# Patient Record
Sex: Female | Born: 1970 | Race: White | Hispanic: No | Smoking: Current every day smoker
Health system: Southern US, Community
[De-identification: ages and names within clinical notes are randomized; demographics above are authoritative.]

## PROBLEM LIST (undated history)

## (undated) HISTORY — PX: EYE SURGERY: SHX253

## (undated) HISTORY — PX: BACK SURGERY: SHX140

---

## 2008-07-18 ENCOUNTER — Emergency Department (HOSPITAL_COMMUNITY): Admission: EM | Admit: 2008-07-18 | Discharge: 2008-07-18 | Payer: Self-pay | Admitting: Family Medicine

## 2009-03-16 ENCOUNTER — Emergency Department (HOSPITAL_COMMUNITY): Admission: EM | Admit: 2009-03-16 | Discharge: 2009-03-16 | Payer: Self-pay | Admitting: Emergency Medicine

## 2010-04-09 LAB — DIFFERENTIAL
Eosinophils Absolute: 0 10*3/uL (ref 0.0–0.7)
Eosinophils Relative: 0 % (ref 0–5)
Lymphs Abs: 0.9 10*3/uL (ref 0.7–4.0)
Monocytes Relative: 6 % (ref 3–12)

## 2010-04-09 LAB — BASIC METABOLIC PANEL
Chloride: 108 mEq/L (ref 96–112)
GFR calc Af Amer: >60 mL/min (ref >60)
GFR calc non Af Amer: >60 mL/min (ref >60)
Glucose, Bld: 126 mg/dL — ABNORMAL HIGH (ref 70–99)
Potassium: 3.6 mEq/L (ref 3.5–5.1)

## 2010-04-09 LAB — URINALYSIS, ROUTINE W REFLEX MICROSCOPIC
Ketones, ur: NEGATIVE mg/dL
Nitrite: NEGATIVE
Urobilinogen, UA: 0.2 mg/dL (ref 0.0–1.0)
pH: 6.5 (ref 5.0–8.0)

## 2010-04-09 LAB — CBC
HCT: 32.2 % — ABNORMAL LOW (ref 36.0–46.0)
MCHC: 35 g/dL (ref 30.0–36.0)
MCV: 100.9 fL — ABNORMAL HIGH (ref 78.0–100.0)
RBC: 3.19 MIL/uL — ABNORMAL LOW (ref 3.87–5.11)
WBC: 12.9 10*3/uL — ABNORMAL HIGH (ref 4.0–10.5)

## 2010-04-09 LAB — URINE MICROSCOPIC-ADD ON

## 2010-04-09 LAB — URINE CULTURE

## 2010-04-28 LAB — POCT URINALYSIS DIP (DEVICE)
Glucose, UA: NEGATIVE mg/dL
Ketones, ur: NEGATIVE mg/dL
Protein, ur: NEGATIVE mg/dL
Specific Gravity, Urine: <=1.005 (ref 1.005–1.030)
Urobilinogen, UA: 0.2 mg/dL (ref 0.0–1.0)
pH: 6 (ref 5.0–8.0)

## 2011-03-02 ENCOUNTER — Ambulatory Visit: Payer: Self-pay | Admitting: Emergency Medicine

## 2011-03-02 VITALS — BP 129/72 | HR 74 | Temp 98.1°F | Resp 16 | Ht 61.5 in | Wt 140.0 lb

## 2011-03-02 DIAGNOSIS — J00 Acute nasopharyngitis [common cold]: Secondary | ICD-10-CM

## 2011-03-02 DIAGNOSIS — J40 Bronchitis, not specified as acute or chronic: Secondary | ICD-10-CM

## 2011-03-02 DIAGNOSIS — J069 Acute upper respiratory infection, unspecified: Secondary | ICD-10-CM

## 2011-03-02 DIAGNOSIS — F17200 Nicotine dependence, unspecified, uncomplicated: Secondary | ICD-10-CM

## 2011-03-02 DIAGNOSIS — R05 Cough: Secondary | ICD-10-CM

## 2011-03-02 MED ORDER — FLUTICASONE PROPIONATE 50 MCG/ACT NA SUSP: 2.0000 | Freq: Every day | NASAL | Status: AC

## 2011-03-02 MED ORDER — BENZONATATE 200 MG PO CAPS: 200.0000 mg | ORAL_CAPSULE | Freq: Two times a day (BID) | ORAL | Status: DC | PRN

## 2011-03-02 MED ORDER — DOXYCYCLINE HYCLATE 100 MG PO TABS: 100.0000 mg | ORAL_TABLET | Freq: Two times a day (BID) | ORAL | Status: AC

## 2011-03-02 MED ORDER — HYDROCODONE-HOMATROPINE 5-1.5 MG/5ML PO SYRP: 5.0000 mL | ORAL_SOLUTION | Freq: Every day | ORAL | Status: DC

## 2011-03-02 NOTE — Progress Notes (Signed)
  Subjective:    Patient ID: Joy Watkins, female    DOB: 04/14/1970, 41 y.o.   MRN: [id number]  HPI patient enters with 4-5 day history of head congestion and nasal to and cough. She has a history recurrent breast infections. She stopped smoking 42 days ago. She had been a heavy smoker for 20 years. She's had a vibrating sensation in her right ear does look within both of the ears. They are benign. She states that she is willing to come back for a chest x-ray at sometime in the coming months but is not able to do that tonight.    Review of Systems her review of systems are just as related to the present illness.     Objective:   Physical Exam her physical exam her TMs are clear nose is congested and turbinates are reddish. Her neck is supple her chest is symmetrical expansion and clear to auscultation and percussion.        Assessment & Plan:  Assessment is recurrent upper respiratory infections. Patient however is for tracheobronchitis due to her heavy smoking history. She has responded well to doxycycline in the past.

## 2011-03-13 ENCOUNTER — Telehealth: Payer: Self-pay

## 2011-03-13 DIAGNOSIS — J40 Bronchitis, not specified as acute or chronic: Secondary | ICD-10-CM

## 2011-03-13 MED ORDER — BENZONATATE 100 MG PO CAPS: ORAL_CAPSULE | ORAL | Status: AC

## 2011-03-13 MED ORDER — HYDROCODONE-HOMATROPINE 5-1.5 MG/5ML PO SYRP: 5.0000 mL | ORAL_SOLUTION | Freq: Every day | ORAL | Status: DC

## 2011-03-13 NOTE — Telephone Encounter (Signed)
Please advise 

## 2011-03-13 NOTE — Telephone Encounter (Signed)
Pt would like a refill on her cough medicine and the also the cough syrup that she was prescribed. Pt does not have the names of the medications.

## 2011-03-13 NOTE — Telephone Encounter (Signed)
Authorized.  The syrup needs to be called in.

## 2011-03-14 NOTE — Telephone Encounter (Signed)
Patient notified and cough syrup called in.

## 2011-06-15 ENCOUNTER — Emergency Department (HOSPITAL_COMMUNITY)
Admission: EM | Admit: 2011-06-15 | Discharge: 2011-06-15 | Disposition: A | Discharge status: Home / Self Care | Payer: Self-pay | Attending: Emergency Medicine | Admitting: Emergency Medicine

## 2011-06-15 ENCOUNTER — Encounter (HOSPITAL_COMMUNITY): Payer: Self-pay | Admitting: Emergency Medicine

## 2011-06-15 DIAGNOSIS — Z87891 Personal history of nicotine dependence: Secondary | ICD-10-CM | POA: Insufficient documentation

## 2011-06-15 DIAGNOSIS — K089 Disorder of teeth and supporting structures, unspecified: Secondary | ICD-10-CM | POA: Insufficient documentation

## 2011-06-15 DIAGNOSIS — K0889 Other specified disorders of teeth and supporting structures: Secondary | ICD-10-CM

## 2011-06-15 MED ORDER — CLINDAMYCIN HCL 300 MG PO CAPS
300.0000 mg | ORAL_CAPSULE | Freq: Once | ORAL | Status: AC
  Administered 2011-06-15: 300 mg via ORAL
  Filled 2011-06-15: qty 1

## 2011-06-15 MED ORDER — HYDROCODONE-ACETAMINOPHEN 5-325 MG PO TABS
2.0000 | ORAL_TABLET | Freq: Once | ORAL | Status: AC
  Administered 2011-06-15: 2 via ORAL
  Filled 2011-06-15: qty 2

## 2011-06-15 MED ORDER — HYDROCODONE-ACETAMINOPHEN 5-500 MG PO TABS: 1.0000 | ORAL_TABLET | Freq: Four times a day (QID) | ORAL | Status: AC | PRN

## 2011-06-15 MED ORDER — CLINDAMYCIN HCL 150 MG PO CAPS: 300.0000 mg | ORAL_CAPSULE | Freq: Three times a day (TID) | ORAL | Status: AC

## 2011-06-15 NOTE — ED Provider Notes (Signed)
History     CSN: [id number]  Arrival date & time 06/15/11  0206   First MD Initiated Contact with Patient 06/15/11 [phone]      Chief Complaint  Patient presents with  . Dental Pain    (Consider location/radiation/quality/duration/timing/severity/associated sxs/prior treatment) HPI Comments: Female with a history of recurrent dental infections and need for routine amounts and has been on amoxicillin for the last 9 days, hydrocodone for pain and no she initially felt better as of late felt increased pain and a purulent taste in her mouth. She is supposed to have root canals done on both the upper right and left molars and has treated prophylactically with antibiotics prior to the surgery. The pain has increased over the last 3 days, is persistent, worse with chewing and has associated increased sensitivity to temperature. She denies any jaw swelling or fevers or vomiting  Patient is a 41 y.o. female presenting with tooth pain. The history is provided by the patient.  Dental PainPrimary symptoms do not include fever or sore throat.  Additional symptoms do not include: facial swelling and trouble swallowing.    History reviewed. No pertinent past medical history.  Past Surgical History  Procedure Date  . Eye surgery   . Back surgery     No family history on file.  History  Substance Use Topics  . Smoking status: Former Games developer  . Smokeless tobacco: Not on file   Comment: quit 3 months ago  . Alcohol Use: 0.6 oz/week    1 Glasses of wine per week    OB History    Grav Para Term Preterm Abortions TAB SAB Ect Mult Living                  Review of Systems  Constitutional: Negative for fever and chills.  HENT: Positive for dental problem. Negative for sore throat, facial swelling, trouble swallowing and voice change.        Toothache  Gastrointestinal: Negative for nausea and vomiting.    Allergies  Zithromax  Home Medications   Current Outpatient Rx  Name Route Sig  Dispense Refill  . AMOXICILLIN 500 MG PO CAPS Oral Take 500 mg by mouth 3 (three) times daily.    Marland Kitchen FLUTICASONE PROPIONATE 50 MCG/ACT NA SUSP Nasal Place 2 sprays into the nose daily. 16 g 6  . HYDROCODONE-ACETAMINOPHEN 5-500 MG PO TABS Oral Take 1 tablet by mouth every 6 (six) hours as needed.    Marland Kitchen CLINDAMYCIN HCL 150 MG PO CAPS Oral Take 2 capsules (300 mg total) by mouth 3 (three) times daily. May dispense as 150mg  capsules 60 capsule 0  . HYDROCODONE-ACETAMINOPHEN 5-500 MG PO TABS Oral Take 1-2 tablets by mouth every 6 (six) hours as needed for pain. 15 tablet 0    BP 126/69  Pulse 75  Temp(Src) 98.3 F (36.8 C) (Oral)  Resp 20  SpO2 100%  LMP 06/01/2011  Physical Exam  Nursing note and vitals reviewed. Constitutional: She appears well-developed and well-nourished. No distress.  HENT:  Head: Normocephalic and atraumatic.  Mouth/Throat: Oropharynx is clear and moist. No oropharyngeal exudate.       Dental Disease - upper right and left rear molars with dental disease, no surrounding palpable abscesses or areas of fluctuance. Tenderness over these teeth, normal sublingual area without tenderness  Eyes: Conjunctivae are normal. No scleral icterus.  Neck: Normal range of motion. Neck supple. No thyromegaly present.  Cardiovascular: Normal rate and regular rhythm.   Pulmonary/Chest: Effort normal  and breath sounds normal.  Lymphadenopathy:    She has cervical adenopathy ( Right).  Neurological: She is alert.  Skin: Skin is warm and dry. No rash noted. She is not diaphoretic.    ED Course  Procedures (including critical care time)  Labs Reviewed - No data to display No results found.   1. Pain, dental       MDM  Overall well appearing with normal vital signs but has tender teeth and likely has periosteal infections, clindamycin ordered and prescribed, hydrocodone for home, followup with dentist in 2 days at her appointment.  Discharge Prescriptions  include:  Clindamycin Hydrocodone         Vida Roller, MD 06/15/11 [phone]

## 2011-06-15 NOTE — ED Notes (Signed)
Pt states she has 10/10 pain in her mouth. She has been taking ATB and has been out for 2 days. She stated when she was taking the ATB, the pain was controlled. Denies bleeding, nausea,vomiting.

## 2011-06-15 NOTE — ED Notes (Signed)
Pt is scheduled for surgery on her teeth on Wednesday but has run out of her amoxicillin and hydrocodone. States she is having trouble eating and the pain is unbearable.

## 2012-04-20 ENCOUNTER — Ambulatory Visit (INDEPENDENT_AMBULATORY_CARE_PROVIDER_SITE_OTHER): Payer: Commercial Managed Care - PPO | Admitting: Family Medicine

## 2012-04-20 VITALS — BP 123/82 | HR 71 | Temp 98.3°F | Resp 16 | Ht 61.0 in | Wt 141.8 lb

## 2012-04-20 DIAGNOSIS — J40 Bronchitis, not specified as acute or chronic: Secondary | ICD-10-CM

## 2012-04-20 DIAGNOSIS — J039 Acute tonsillitis, unspecified: Secondary | ICD-10-CM

## 2012-04-20 MED ORDER — AMOXICILLIN 500 MG PO CAPS: 500.0000 mg | ORAL_CAPSULE | Freq: Three times a day (TID) | ORAL | Status: DC

## 2012-04-20 MED ORDER — BENZONATATE 200 MG PO CAPS: 200.0000 mg | ORAL_CAPSULE | Freq: Two times a day (BID) | ORAL | Status: AC | PRN

## 2012-04-20 MED ORDER — HYDROXYZINE HCL 25 MG PO TABS: 25.0000 mg | ORAL_TABLET | Freq: Three times a day (TID) | ORAL | Status: AC | PRN

## 2012-04-20 MED ORDER — BENZONATATE 200 MG PO CAPS: 200.0000 mg | ORAL_CAPSULE | Freq: Two times a day (BID) | ORAL | Status: DC | PRN

## 2012-04-20 MED ORDER — HYDROCODONE-HOMATROPINE 5-1.5 MG/5ML PO SYRP: 2.5000 mL | ORAL_SOLUTION | Freq: Every day | ORAL | Status: AC

## 2012-04-20 MED ORDER — HYDROXYZINE HCL 25 MG PO TABS: 25.0000 mg | ORAL_TABLET | Freq: Three times a day (TID) | ORAL | Status: DC | PRN

## 2012-04-20 MED ORDER — AMOXICILLIN 500 MG PO CAPS: 500.0000 mg | ORAL_CAPSULE | Freq: Three times a day (TID) | ORAL | Status: AC

## 2012-04-20 NOTE — Patient Instructions (Signed)
Continue flonase Continue tessalon Perles Try the antibiotic I am giving you.  Try the hydroxyzine to help with sleep as well as allergies.  I would add zyrtec 10mg  at night to help with underlying allergies that I think are contributing.  Come back in in 2 days if not better and we can discuss cough medicine.

## 2012-04-20 NOTE — Progress Notes (Signed)
Chief complaint: Sore ear right side and cough x3 weeks  History of present illness: Patient is a 42 year old female who is on chronic pain medications Coumadin with a three-week history of cough, sinus pressure, and right ear fullness. Patient denies any fevers chills, chest pain, shortness of breath. Patient has had this problem before and was diagnosed with bronchitis was given azithromycin and did have an allergic reaction. Patient did respond very well to amoxicillin. Patient's concern the distal fragment was a bronchitis and is hoping that she could same treatment regimen as before. Patient has tried to continue her Flonase, has tried [employer] with minimal benefit. The patient states the cough is keep her at night which is making this illness worse   physical exam: Blood pressure 123/82, pulse 71, temperature 98.3 F (36.8 C), temperature source Oral, resp. rate 16, height 5\' 1"  (1.549 m), weight 141 lb 12.8 oz (64.32 kg), SpO2 99.00%. General: No apparent distress alert and oriented x3 mood and affect normal HEENT: Pupils equal round Accommodation, extra ocular movements intact, tympanic membranes are visualized bilaterally and does have mild air fluid levels behind the tympanic membrane. This is nonbulging and nonerythematous. Posterior pharynx does have some erythema with possible exudate. Patient does have sinus pressure pain and tenderness to percussion. Positive anterior lymphadenopathy. Pulmonary: Clear to auscultation bilaterally Cardiovascular: Regular and rhythm no murmur appreciated Skin: Warm dry no signs of rash.   Assessment: Tonsillitis question will sinusitis  Plan: Patient was given amoxicillin was given a refill of her Tessalon Perles. After a significantly long discussion I did decide to give patient some posterior. Patient told that she would like a refill of this medication secondary to her pain medication contractured has. Patient will call to make sure that it is  okay with her pain management had been medication failed. Only gave her 60 mL I. would not refill. Patient was told about other medications that could be helpful including hydroxyzine that would help her allergies as well as I think her underlying anxiety problem. Patient was given amoxicillin. The patient will followup when she comes back from out of town if necessary.

## 2014-12-18 ENCOUNTER — Ambulatory Visit (INDEPENDENT_AMBULATORY_CARE_PROVIDER_SITE_OTHER): Payer: Commercial Managed Care - PPO | Admitting: Family Medicine

## 2014-12-18 ENCOUNTER — Ambulatory Visit (INDEPENDENT_AMBULATORY_CARE_PROVIDER_SITE_OTHER): Payer: Commercial Managed Care - PPO

## 2014-12-18 VITALS — BP 120/80 | HR 85 | Temp 98.7°F | Resp 18 | Ht 62.0 in | Wt 140.2 lb

## 2014-12-18 DIAGNOSIS — R5382 Chronic fatigue, unspecified: Secondary | ICD-10-CM | POA: Diagnosis not present

## 2014-12-18 DIAGNOSIS — M799 Soft tissue disorder, unspecified: Secondary | ICD-10-CM

## 2014-12-18 DIAGNOSIS — M7989 Other specified soft tissue disorders: Secondary | ICD-10-CM

## 2014-12-18 DIAGNOSIS — G4700 Insomnia, unspecified: Secondary | ICD-10-CM | POA: Diagnosis not present

## 2014-12-18 LAB — POCT GLYCOSYLATED HEMOGLOBIN (HGB A1C): HEMOGLOBIN A1C: 5.3

## 2014-12-18 LAB — HEMOGLOBIN A1C: HEMOGLOBIN A1C: 5.3 % (ref 4.0–6.0)

## 2014-12-18 NOTE — Progress Notes (Signed)
[facility] [address], [place] [address] [phone]  Date:  12/18/2014   Name:  Joy Watkins   DOB:  26-Dec-1970   MRN:  [id number]  PCP:  No PCP Per Patient    Chief Complaint: Mass   History of Present Illness:  Joy Watkins is a 44 y.o. very pleasant female patient who presents with the following: Left arm mass anterolateral to deltoid:  - 15 lb weight loss over the last 6 weeks. Very busy. No intentional weight loss - Growing slowly, non-painful.  - last 3 months she has noticed it - No injuries.  - No weakness or pain.    Fatigue: - 1 year, worsening - Can't focus, remember - Poor sleep: both falling and staying a sleep.  -- Usually only able to sleep 3-4 hours at a time.  -- Previously took [place], but didn't like how it made her feel the next day.  -- Has also tried melatonin.  -- Drinks caffeine up until 6 pm, tries to sleep at 12-1am.   ---4-5 smalls red bulls, 2 pots of coffee a day.  Chiropodist, very busy.  - Tries to keep a stable bedtime routine. Lays in bed for several hours.  - Forgetful overall.   - No apneic events or snoring.   - Tired throughout the day.   - 1 cig per day.   - Regular periods.  - NO stressful life events prior to fatigue starting.   There are no active problems to display for this patient.   No past medical history on file.  Past Surgical History  Procedure Laterality Date  . Eye surgery    . Back surgery      Social History  Substance Use Topics  . Smoking status: Current Every Day Smoker  . Smokeless tobacco: None     Comment: quit 3 months ago  . Alcohol Use: 0.6 oz/week    1 Glasses of wine per week    No family history on file.  Allergies  Allergen Reactions  . Zithromax [Azithromycin] Hives    Medication list has been reviewed and updated.  Current Outpatient Prescriptions on File Prior to Visit  Medication Sig Dispense Refill  . HYDROcodone-acetaminophen (VICODIN) 5-500  MG per tablet Take 1 tablet by mouth every 6 (six) hours as needed.    . traMADol (ULTRAM) 50 MG tablet Take 50 mg by mouth every 6 (six) hours as needed for pain.    Marland Kitchen amoxicillin (AMOXIL) 500 MG capsule Take 1 capsule (500 mg total) by mouth 3 (three) times daily. (Patient not taking: Reported on 12/18/2014) 30 capsule 0  . fluticasone (FLONASE) 50 MCG/ACT nasal spray Place 2 sprays into the nose daily. (Patient not taking: Reported on 12/18/2014) 16 g 6  . hydrOXYzine (ATARAX/VISTARIL) 25 MG tablet Take 1 tablet (25 mg total) by mouth 3 (three) times daily as needed for itching. (Patient not taking: Reported on 12/18/2014) 30 tablet 0   No current facility-administered medications on file prior to visit.    Review of Systems:  Review of Systems  Constitutional: Positive for weight loss (15 lbs in last 6 weeks. ) and malaise/fatigue (last year. ). Negative for fever and chills.  Eyes: Negative for blurred vision and double vision.  Respiratory: Negative for cough and shortness of breath.   Cardiovascular: Negative for chest pain.  Gastrointestinal: Negative for nausea, vomiting, abdominal pain, diarrhea and constipation.  Skin: Negative for rash.  Neurological: Negative for  dizziness and headaches.  Psychiatric/Behavioral: Positive for substance abuse. Negative for depression and suicidal ideas.    Physical Examination: Filed Vitals:   12/18/14 1755  BP: 120/80  Pulse: 85  Temp: 98.7 F (37.1 C)  Resp: 18   Filed Vitals:   12/18/14 1755  Height: 5\' 2"  (1.575 m)  Weight: 140 lb 4 oz (63.617 kg)   Body mass index is 25.65 kg/(m^2). Ideal Body Weight: Weight in (lb) to have BMI = 25: 136.4  GEN: WDWN, NAD, Non-toxic, A & O x 3 HEENT: Atraumatic, Normocephalic. Neck supple. No masses, No LAD. Ears and Nose: No external deformity. CV: RRR, No M/G/R. No JVD. No thrill. No extra heart sounds. PULM: CTA B, no wheezes, crackles, rhonchi. No retractions. No resp. distress. No  accessory muscle use. ABD: S, NT, ND, +BS. No rebound. No HSM. EXTR: No c/c/e. 4cm mass on left lateral shoulder, appearing to overly the deltoid.  Depending on maneuver it does appear to firm up although overall it is soft and mobile in nature.  NEURO Normal gait.  PSYCH: Normally interactive. Conversant. Not depressed or anxious appearing.  Calm demeanor.   [facility] reading (PRIMARY) by  Dr. Jimmye Norman. No soft tissue abnormality identified.    Assessment and Plan: Soft Tissue mass, left shoulder: XR does not appear to show and calcification or osseus abnormality.  Will get u/s to further evaluate.  Weight loss is slightly concerning.  Fatigue: Poor sleep with excessive caffeine use. Advised decreasing caffeine and other sleep hygiene recommendations. 1 year history of fatigue.  Labs (CMP, TSH, VitD, CBC, A1c, HIV). Denies depression. May need sleep study if all other labs are negative. F/u after labs.   Signed Gerre Pebbles, MD

## 2014-12-18 NOTE — Patient Instructions (Signed)
Insomnia Insomnia is a sleep disorder that makes it difficult to fall asleep or to stay asleep. Insomnia can cause tiredness (fatigue), low energy, difficulty concentrating, mood swings, and poor performance at work or school.  There are three different ways to classify insomnia:  Difficulty falling asleep.  Difficulty staying asleep.  Waking up too early in the morning. Any type of insomnia can be long-term (chronic) or short-term (acute). Both are common. Short-term insomnia usually lasts for three months or less. Chronic insomnia occurs at least three times a week for longer than three months. CAUSES  Insomnia may be caused by another condition, situation, or substance, such as:  Anxiety.  Certain medicines.  Gastroesophageal reflux disease (GERD) or other gastrointestinal conditions.  Asthma or other breathing conditions.  Restless legs syndrome, sleep apnea, or other sleep disorders.  Chronic pain.  Menopause. This may include hot flashes.  Stroke.  Abuse of alcohol, tobacco, or illegal drugs.  Depression.  Caffeine.   Neurological disorders, such as Alzheimer disease.  An overactive thyroid (hyperthyroidism). The cause of insomnia may not be known. RISK FACTORS Risk factors for insomnia include:  Gender. Women are more commonly affected than men.  Age. Insomnia is more common as you get older.  Stress. This may involve your professional or personal life.  Income. Insomnia is more common in people with lower income.  Lack of exercise.   Irregular work schedule or night shifts.  Traveling between different time zones. SIGNS AND SYMPTOMS If you have insomnia, trouble falling asleep or trouble staying asleep is the main symptom. This may lead to other symptoms, such as:  Feeling fatigued.  Feeling nervous about going to sleep.  Not feeling rested in the morning.  Having trouble concentrating.  Feeling irritable, anxious, or depressed. TREATMENT   Treatment for insomnia depends on the cause. If your insomnia is caused by an underlying condition, treatment will focus on addressing the condition. Treatment may also include:   Medicines to help you sleep.  Counseling or therapy.  Lifestyle adjustments. HOME CARE INSTRUCTIONS   Take medicines only as directed by your health care provider.  Keep regular sleeping and waking hours. Avoid naps.  Keep a sleep diary to help you and your health care provider figure out what could be causing your insomnia. Include:   When you sleep.  When you wake up during the night.  How well you sleep.   How rested you feel the next day.  Any side effects of medicines you are taking.  What you eat and drink.   Make your bedroom a comfortable place where it is easy to fall asleep:  Put up shades or special blackout curtains to block light from outside.  Use a white noise machine to block noise.  Keep the temperature cool.   Exercise regularly as directed by your health care provider. Avoid exercising right before bedtime.  Use relaxation techniques to manage stress. Ask your health care provider to suggest some techniques that may work well for you. These may include:  Breathing exercises.  Routines to release muscle tension.  Visualizing peaceful scenes.  Cut back on alcohol, caffeinated beverages, and cigarettes, especially close to bedtime. These can disrupt your sleep.  Do not overeat or eat spicy foods right before bedtime. This can lead to digestive discomfort that can make it hard for you to sleep.  Limit screen use before bedtime. This includes:  Watching TV.  Using your smartphone, tablet, and computer.  Stick to a routine. This   can help you fall asleep faster. Try to do a quiet activity, brush your teeth, and go to bed at the same time each night.  Get out of bed if you are still awake after 15 minutes of trying to sleep. Keep the lights down, but try reading or  doing a quiet activity. When you feel sleepy, go back to bed.  Make sure that you drive carefully. Avoid driving if you feel very sleepy.  Keep all follow-up appointments as directed by your health care provider. This is important. SEEK MEDICAL CARE IF:   You are tired throughout the day or have trouble in your daily routine due to sleepiness.  You continue to have sleep problems or your sleep problems get worse. SEEK IMMEDIATE MEDICAL CARE IF:   You have serious thoughts about hurting yourself or someone else.   This information is not intended to replace advice given to you by your health care provider. Make sure you discuss any questions you have with your health care provider.   Document Released: 01/03/2000 Document Revised: 09/26/2014 Document Reviewed: 10/06/2013 Elsevier Interactive Patient Education 2016 [employer]. Fatigue Fatigue is feeling tired all of the time, a lack of energy, or a lack of motivation. Occasional or mild fatigue is often a normal response to activity or life in general. However, long-lasting (chronic) or extreme fatigue may indicate an underlying medical condition. HOME CARE INSTRUCTIONS  Watch your fatigue for any changes. The following actions may help to lessen any discomfort you are feeling:  Talk to your health care provider about how much sleep you need each night. Try to get the required amount every night.  Take medicines only as directed by your health care provider.  Eat a healthy and nutritious diet. Ask your health care provider if you need help changing your diet.  Drink enough fluid to keep your urine clear or pale yellow.  Practice ways of relaxing, such as yoga, meditation, massage therapy, or acupuncture.  Exercise regularly.   Change situations that cause you stress. Try to keep your work and personal routine reasonable.  Do not abuse illegal drugs.  Limit alcohol intake to no more than 1 drink per day for nonpregnant women  and 2 drinks per day for men. One drink equals 12 ounces of beer, 5 ounces of wine, or 1 ounces of hard liquor.  Take a multivitamin, if directed by your health care provider. SEEK MEDICAL CARE IF:   Your fatigue does not get better.  You have a fever.   You have unintentional weight loss or gain.  You have headaches.   You have difficulty:   Falling asleep.  Sleeping throughout the night.  You feel angry, guilty, anxious, or sad.   You are unable to have a bowel movement (constipation).   You skin is dry.   Your legs or another part of your body is swollen.  SEEK IMMEDIATE MEDICAL CARE IF:   You feel confused.   Your vision is blurry.  You feel faint or pass out.   You have a severe headache.   You have severe abdominal, pelvic, or back pain.   You have chest pain, shortness of breath, or an irregular or fast heartbeat.   You are unable to urinate or you urinate less than normal.   You develop abnormal bleeding, such as bleeding from the rectum, vagina, nose, lungs, or nipples.  You vomit blood.   You have thoughts about harming yourself or committing suicide.   You  are worried that you might harm someone else.    This information is not intended to replace advice given to you by your health care provider. Make sure you discuss any questions you have with your health care provider.   Document Released: 11/02/2006 Document Revised: 01/26/2014 Document Reviewed: 05/09/2013 Elsevier Interactive Patient Education [employer].

## 2014-12-19 LAB — CBC WITH DIFFERENTIAL/PLATELET
BASOS ABS: 0 10*3/uL (ref 0.0–0.1)
Basophils Relative: 0 % (ref 0–1)
Eosinophils Absolute: 0.2 10*3/uL (ref 0.0–0.7)
Eosinophils Relative: 2 % (ref 0–5)
HEMATOCRIT: 38.8 % (ref 36.0–46.0)
HEMOGLOBIN: 13.1 g/dL (ref 12.0–15.0)
LYMPHS ABS: 3.4 10*3/uL (ref 0.7–4.0)
LYMPHS PCT: 41 % (ref 12–46)
MCH: 33.5 pg (ref 26.0–34.0)
MCHC: 33.8 g/dL (ref 30.0–36.0)
MCV: 99.2 fL (ref 78.0–100.0)
MONOS PCT: 7 % (ref 3–12)
MPV: 8.7 fL (ref 8.6–12.4)
Monocytes Absolute: 0.6 10*3/uL (ref 0.1–1.0)
NEUTROS PCT: 50 % (ref 43–77)
Neutro Abs: 4.1 10*3/uL (ref 1.7–7.7)
Platelets: 363 10*3/uL (ref 150–400)
RBC: 3.91 MIL/uL (ref 3.87–5.11)
RDW: 12.4 % (ref 11.5–15.5)
WBC: 8.2 10*3/uL (ref 4.0–10.5)

## 2014-12-19 LAB — LIPID PANEL
CHOL/HDL RATIO: 1.8 ratio (ref <=5.0)
Cholesterol: 169 mg/dL (ref 125–200)
HDL: 92 mg/dL (ref >=46)
LDL CALC: 52 mg/dL (ref <130)
TRIGLYCERIDES: 126 mg/dL (ref <150)
VLDL: 25 mg/dL (ref <30)

## 2014-12-19 LAB — COMPREHENSIVE METABOLIC PANEL
ALBUMIN: 4.3 g/dL (ref 3.6–5.1)
ALT: 13 U/L (ref 6–29)
AST: 16 U/L (ref 10–35)
Alkaline Phosphatase: 38 U/L (ref 33–115)
BILIRUBIN TOTAL: 0.5 mg/dL (ref 0.2–1.2)
BUN: 15 mg/dL (ref 7–25)
CALCIUM: 9.3 mg/dL (ref 8.6–10.2)
CHLORIDE: 104 mmol/L (ref 98–110)
CO2: 24 mmol/L (ref 20–31)
Creat: 0.61 mg/dL (ref 0.50–1.10)
Glucose, Bld: 78 mg/dL (ref 65–99)
POTASSIUM: 4.6 mmol/L (ref 3.5–5.3)
Sodium: 139 mmol/L (ref 135–146)
Total Protein: 6.7 g/dL (ref 6.1–8.1)

## 2014-12-19 LAB — HIV ANTIBODY (ROUTINE TESTING W REFLEX): HIV 1&2 Ab, 4th Generation: NONREACTIVE

## 2014-12-19 LAB — TSH: TSH: 1.382 u[IU]/mL (ref 0.350–4.500)

## 2014-12-20 LAB — VITAMIN D 25 HYDROXY (VIT D DEFICIENCY, FRACTURES): VIT D 25 HYDROXY: 8 ng/mL — AB (ref 30–100)

## 2014-12-21 ENCOUNTER — Other Ambulatory Visit: Payer: Self-pay | Admitting: Family Medicine

## 2014-12-21 ENCOUNTER — Other Ambulatory Visit: Payer: Self-pay

## 2014-12-21 DIAGNOSIS — E559 Vitamin D deficiency, unspecified: Secondary | ICD-10-CM | POA: Insufficient documentation

## 2014-12-21 MED ORDER — VITAMIN D (ERGOCALCIFEROL) 1.25 MG (50000 UNIT) PO CAPS: 50000.0000 [IU] | ORAL_CAPSULE | ORAL | Status: AC

## 2014-12-24 ENCOUNTER — Other Ambulatory Visit: Payer: Self-pay | Admitting: Family Medicine

## 2014-12-24 ENCOUNTER — Other Ambulatory Visit: Payer: Self-pay

## 2014-12-24 ENCOUNTER — Ambulatory Visit
Admission: RE | Admit: 2014-12-24 | Discharge: 2014-12-24 | Disposition: A | Discharge status: Home / Self Care | Payer: Commercial Managed Care - PPO | Source: Ambulatory Visit | Attending: Family Medicine | Admitting: Family Medicine

## 2014-12-24 DIAGNOSIS — M7989 Other specified soft tissue disorders: Secondary | ICD-10-CM

## 2014-12-26 ENCOUNTER — Telehealth: Payer: Self-pay | Admitting: Family Medicine

## 2014-12-26 DIAGNOSIS — D1720 Benign lipomatous neoplasm of skin and subcutaneous tissue of unspecified limb: Secondary | ICD-10-CM

## 2014-12-26 NOTE — Telephone Encounter (Signed)
Spoke with patient, she does not want to watch and wait and do serial imaging studies if enlarges, she wants to see a surgeon to get it removed. Refer to plastics or general surgery

## 2015-01-03 ENCOUNTER — Encounter: Payer: Self-pay | Admitting: Family Medicine

## 2016-08-28 IMAGING — CR DG SHOULDER 2+V*L*
2 series · 2 of 2 positions shown · non-contrast
Comparison: Chest radiograph dated 05/22/2009

CLINICAL DATA: 44-year-old female with a soft tissue mass over the
lateral aspect of the left shoulder.

EXAM:
LEFT SHOULDER - 2+ VIEW

[ap ext rot]
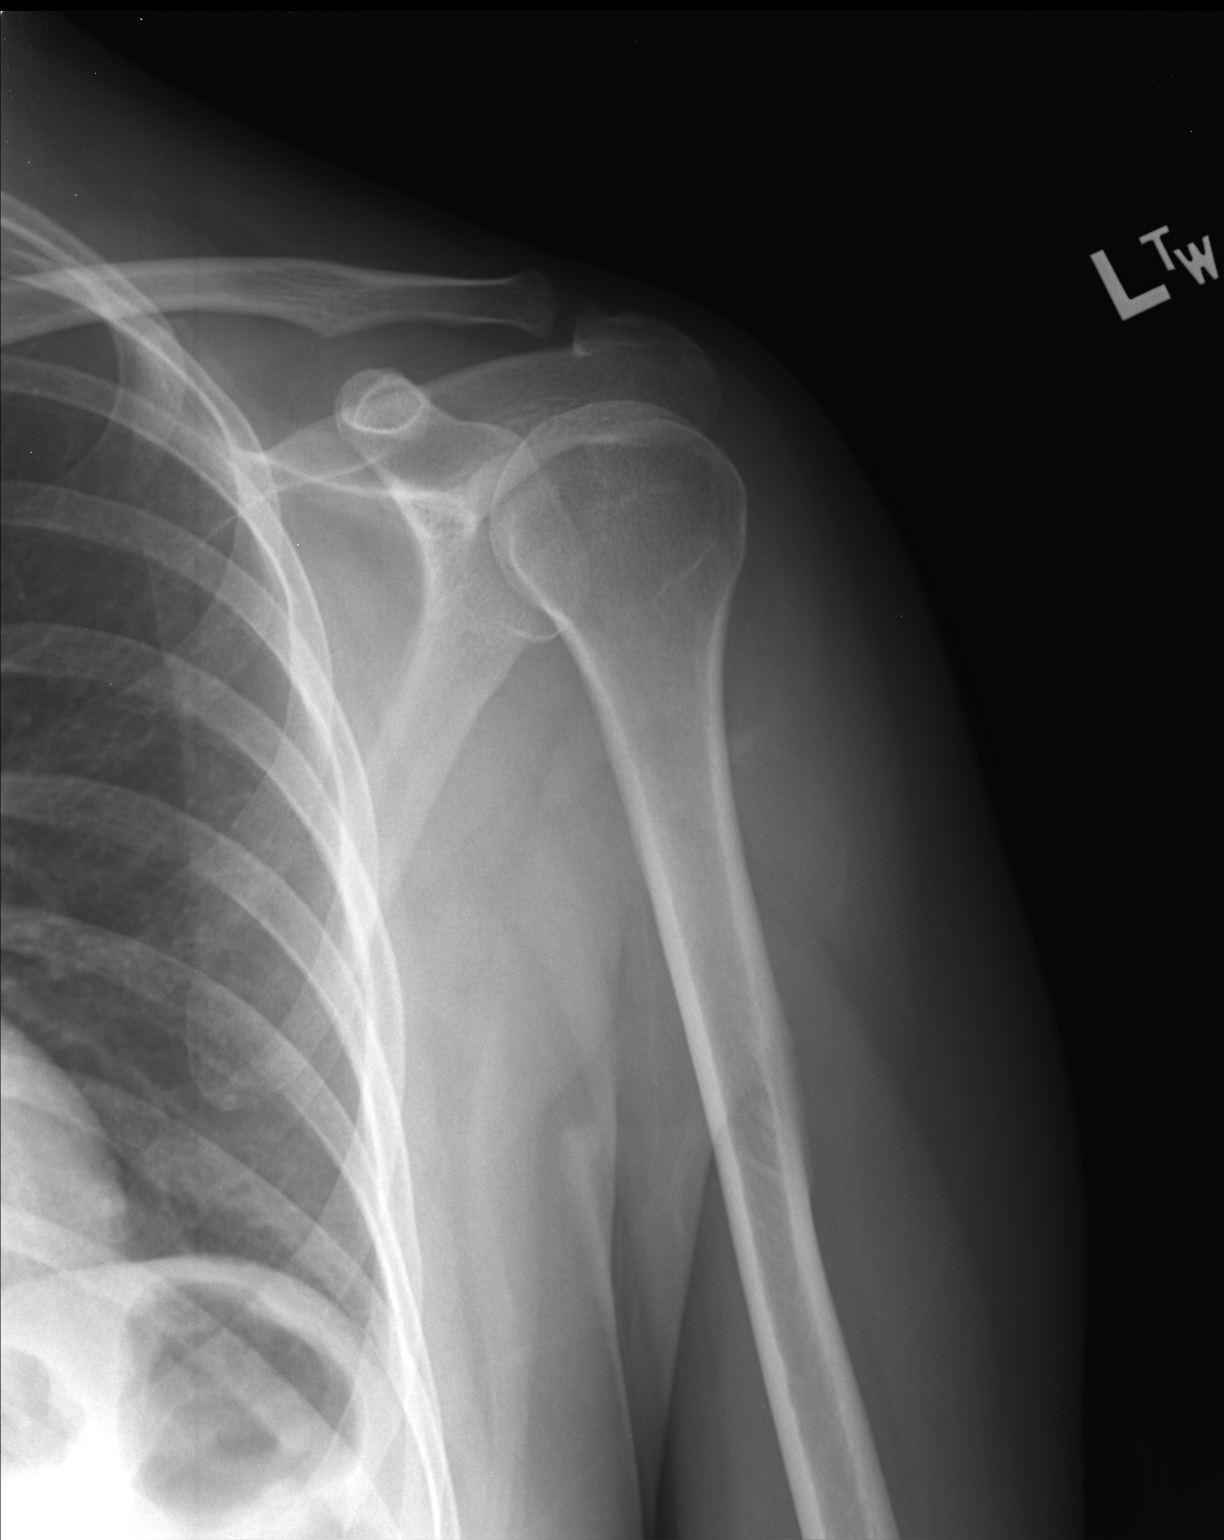

[ap int rot]
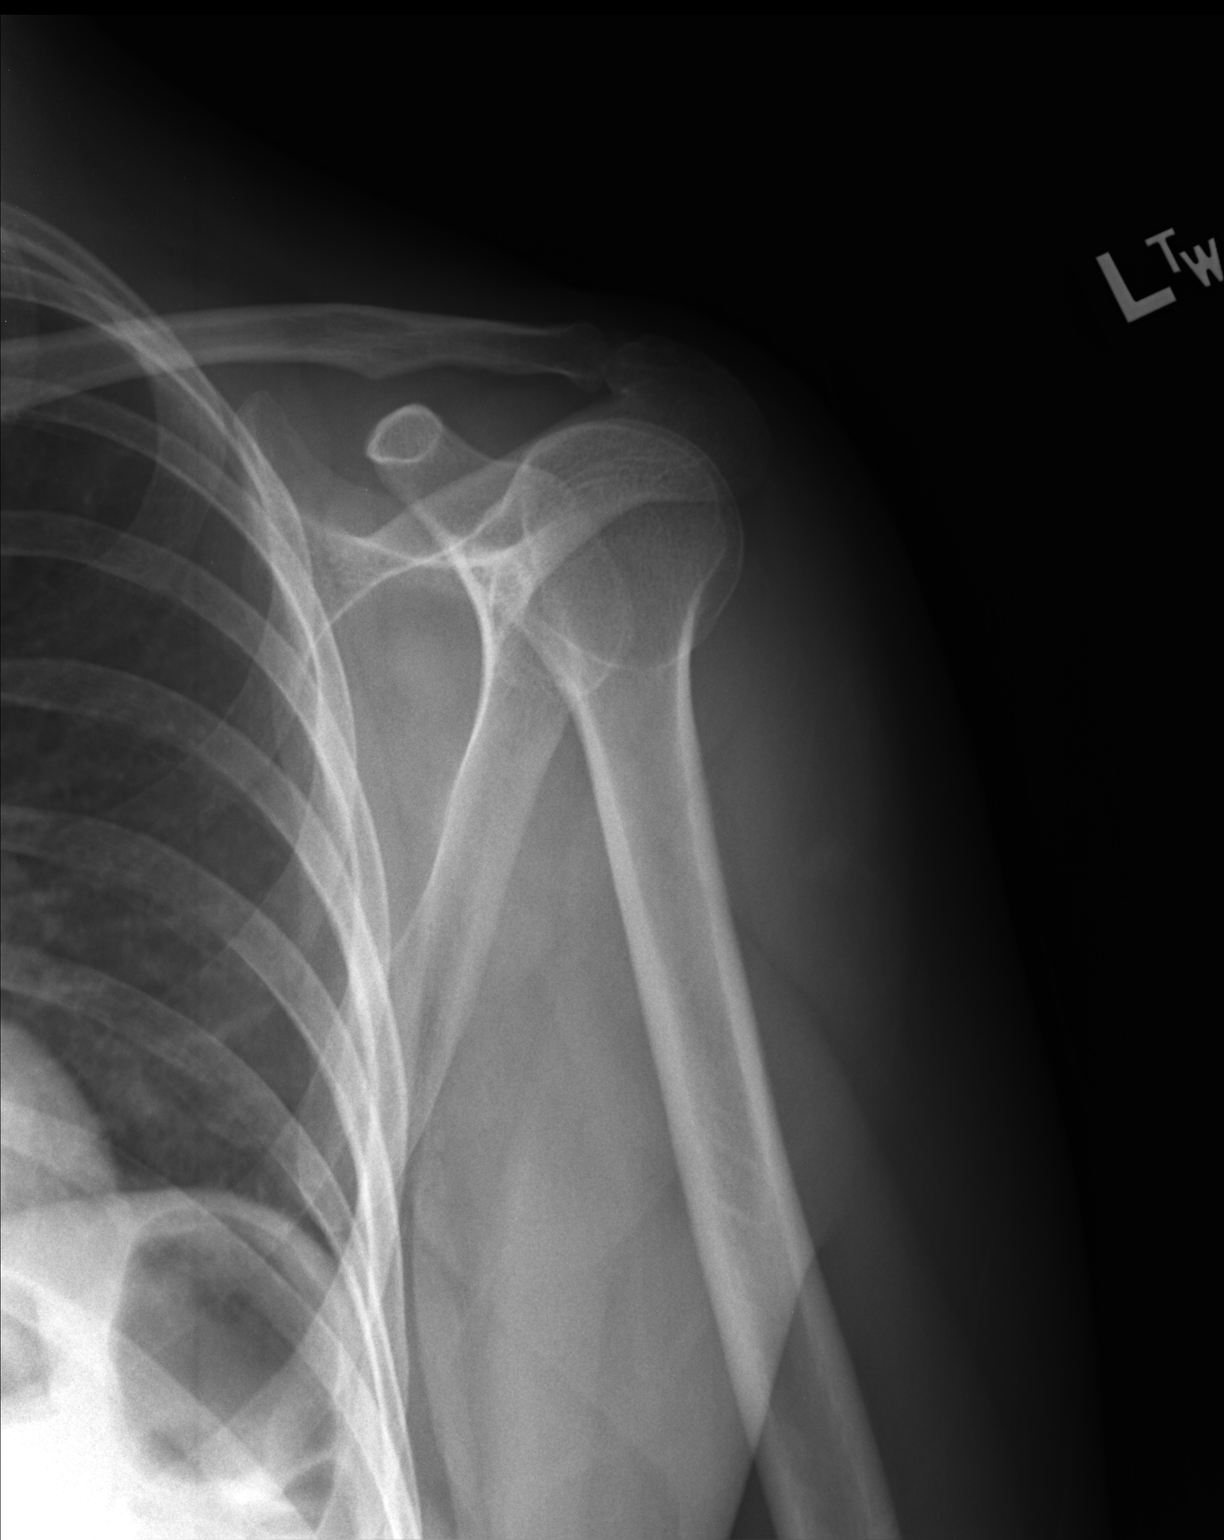

[2 of 2 positions shown; findings below may reference images not displayed]

FINDINGS: The osseous structures are intact. There is no dislocation. No
definite soft tissue mass identified. There is a faint curvilinear
appearing density along the lateral aspect of the proximal humerus
which may be artifactual. If this corresponds to the area of
clinical concern further evaluation with ultrasound is recommended.
CT may provide additional information if clinically indicated.
IMPRESSION: No acute osseous pathology.

Faint curvilinear density along the lateral aspect of the proximal
humerus which may be artifactual. Correlation with clinical exam and
further imaging evaluation if clinically indicated recommended.

## 2016-09-03 IMAGING — US US MISC SOFT TISSUE
1 series · 10 of 10 positions shown · non-contrast
Comparison: Left shoulder series [DATE] 10/09/2014.

CLINICAL DATA: Soft tissue mass.

EXAM:
SOFT TISSUE ULTRASOUND - MISCELLANEOUS
TECHNIQUE: Ultrasound examination of the thyroid gland and adjacent soft
tissues was performed.

[Series 1: us misc soft tissue · 0.08mm/px · 10 of 10 slices shown]
[im 1/10]
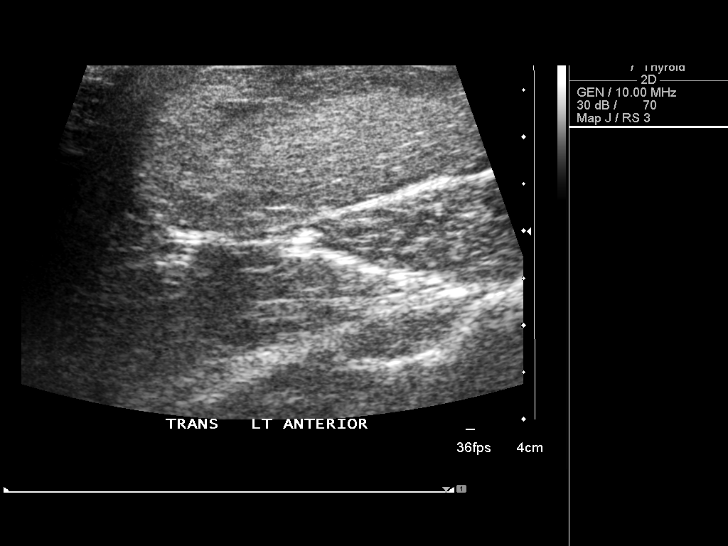
[im 2/10]
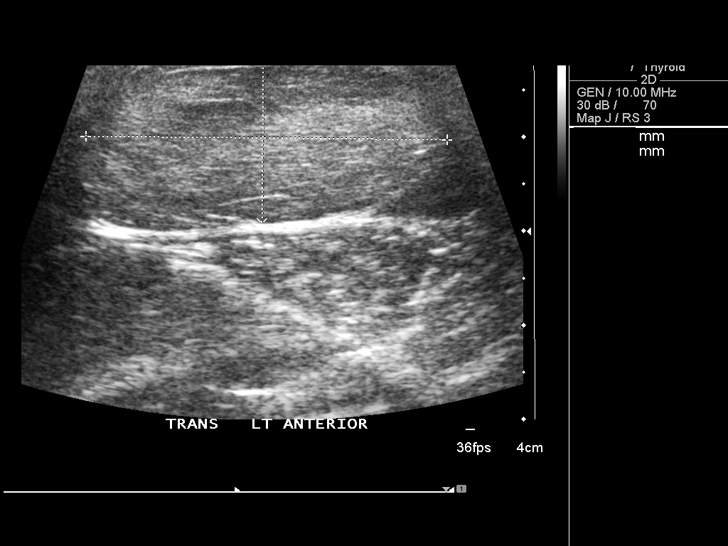
[im 3/10]
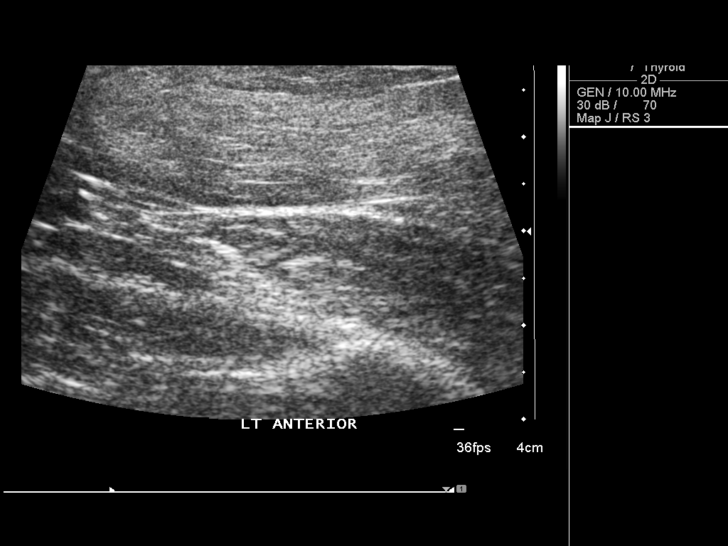
[im 4/10]
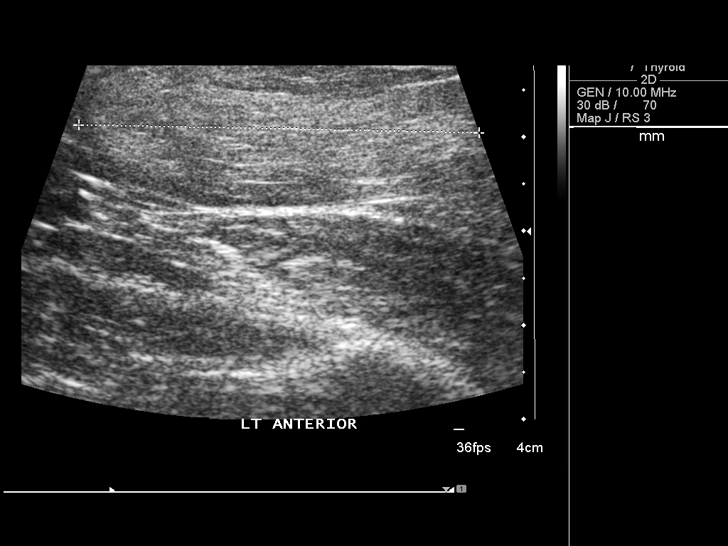
[im 5/10]
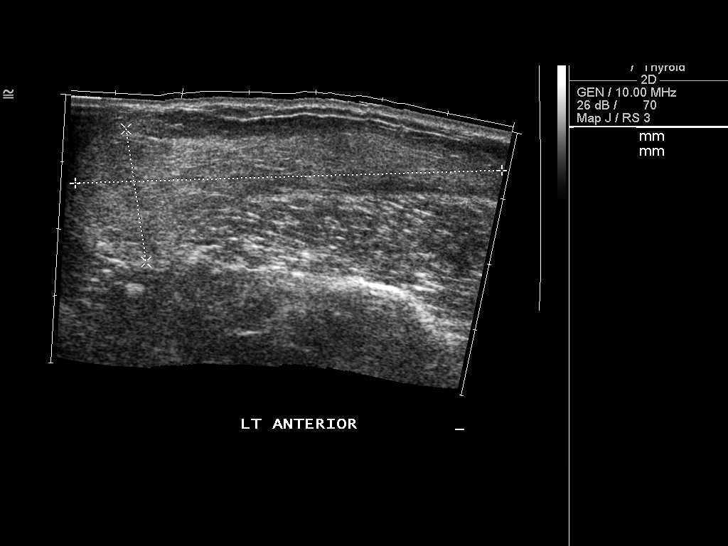
[im 6/10]
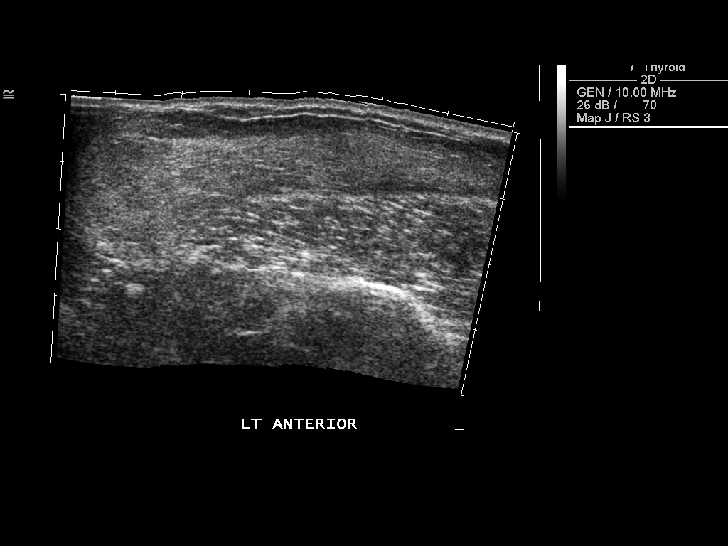
[im 7/10]
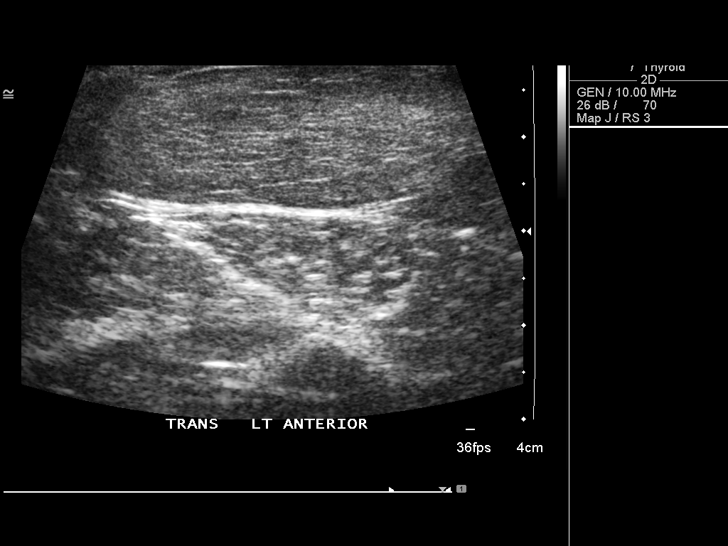
[im 8/10]
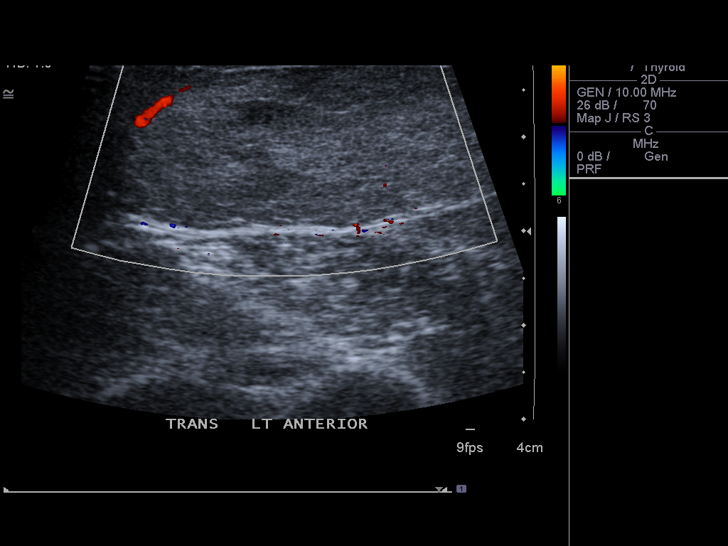
[im 9/10]
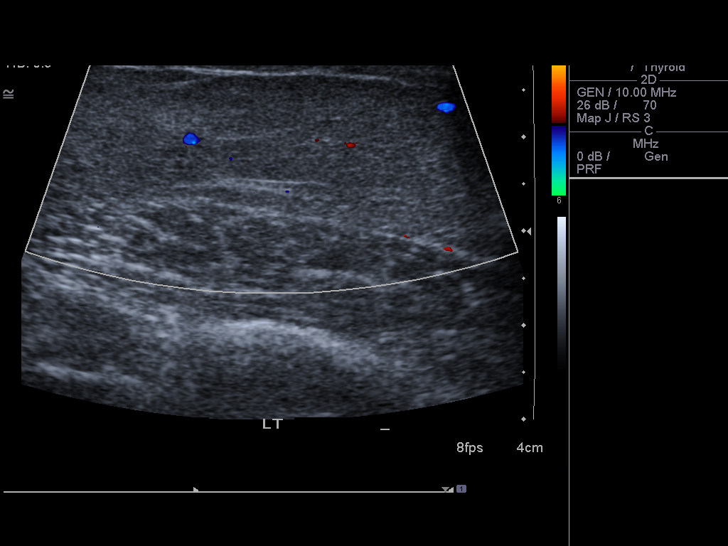
[im 10/10]
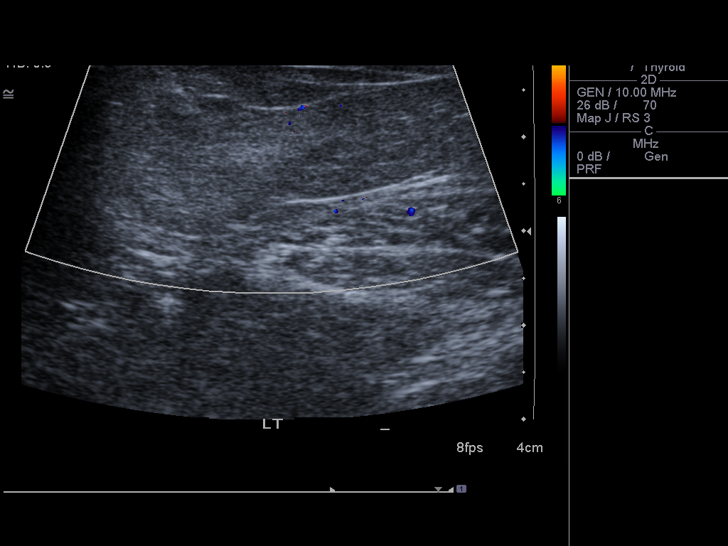

[10 of 10 positions shown; findings below may reference images not displayed]

FINDINGS: 6.4 x 2.0 x 3.8 cm hyperechoic lesion noted in the left anterior
shoulder in the region of clinical concern. This is most consistent
with a lipoma. Confirmation with gadolinium-enhanced MRI can be
obtained as needed.
IMPRESSION: 6.4 x 2.0 x 3.8 cm mass most consistent with a lipoma in the area of
clinical concern in the left shoulder.
